# Patient Record
Sex: Female | Born: 1990 | Hispanic: Yes | Marital: Single
Health system: Southern US, Community
[De-identification: ages and names within clinical notes are randomized; demographics above are authoritative.]

---

## 2019-03-17 ENCOUNTER — Emergency Department

## 2019-03-17 ENCOUNTER — Emergency Department
Admission: EM | Admit: 2019-03-17 | Discharge: 2019-03-17 | Disposition: A | Discharge status: Home / Self Care | Attending: Emergency Medicine | Admitting: Emergency Medicine

## 2019-03-17 ENCOUNTER — Other Ambulatory Visit: Payer: Self-pay

## 2019-03-17 DIAGNOSIS — R1031 Right lower quadrant pain: Secondary | ICD-10-CM | POA: Diagnosis present

## 2019-03-17 DIAGNOSIS — N83201 Unspecified ovarian cyst, right side: Secondary | ICD-10-CM

## 2019-03-17 LAB — COMPREHENSIVE METABOLIC PANEL
ALT: 26 U/L (ref 0–44)
AST: 27 U/L (ref 15–41)
Albumin: 3.9 g/dL (ref 3.5–5.0)
Alkaline Phosphatase: 101 U/L (ref 38–126)
Anion gap: 6 (ref 5–15)
BUN: 13 mg/dL (ref 6–20)
CO2: 26 mmol/L (ref 22–32)
Calcium: 9.2 mg/dL (ref 8.9–10.3)
Chloride: 108 mmol/L (ref 98–111)
Creatinine, Ser: 0.58 mg/dL (ref 0.44–1.00)
GFR calc Af Amer: >60 mL/min (ref >60)
GFR calc non Af Amer: >60 mL/min (ref >60)
Glucose, Bld: 101 mg/dL — ABNORMAL HIGH (ref 70–99)
Potassium: 3.9 mmol/L (ref 3.5–5.1)
Sodium: 140 mmol/L (ref 135–145)
Total Bilirubin: 0.3 mg/dL (ref 0.3–1.2)
Total Protein: 8 g/dL (ref 6.5–8.1)

## 2019-03-17 LAB — CBC WITH DIFFERENTIAL/PLATELET
Abs Immature Granulocytes: 0.04 10*3/uL (ref 0.00–0.07)
Basophils Absolute: 0 10*3/uL (ref 0.0–0.1)
Basophils Relative: 0 %
Eosinophils Absolute: 0.2 10*3/uL (ref 0.0–0.5)
Eosinophils Relative: 2 %
HCT: 39.8 % (ref 36.0–46.0)
Hemoglobin: 13.2 g/dL (ref 12.0–15.0)
Immature Granulocytes: 0 %
Lymphocytes Relative: 24 %
Lymphs Abs: 2.7 10*3/uL (ref 0.7–4.0)
MCH: 27.7 pg (ref 26.0–34.0)
MCHC: 33.2 g/dL (ref 30.0–36.0)
MCV: 83.6 fL (ref 80.0–100.0)
Monocytes Absolute: 0.6 10*3/uL (ref 0.1–1.0)
Monocytes Relative: 6 %
Neutro Abs: 7.5 10*3/uL (ref 1.7–7.7)
Neutrophils Relative %: 68 %
Platelets: 277 10*3/uL (ref 150–400)
RBC: 4.76 MIL/uL (ref 3.87–5.11)
RDW: 12.6 % (ref 11.5–15.5)
WBC: 11.1 10*3/uL — ABNORMAL HIGH (ref 4.0–10.5)
nRBC: 0 % (ref 0.0–0.2)

## 2019-03-17 LAB — URINALYSIS, COMPLETE (UACMP) WITH MICROSCOPIC
Bacteria, UA: NONE SEEN
Bilirubin Urine: NEGATIVE
Glucose, UA: NEGATIVE mg/dL
Ketones, ur: NEGATIVE mg/dL
Nitrite: NEGATIVE
Protein, ur: NEGATIVE mg/dL
Specific Gravity, Urine: 1.024 (ref 1.005–1.030)
pH: 6 (ref 5.0–8.0)

## 2019-03-17 LAB — POCT PREGNANCY, URINE: Preg Test, Ur: NEGATIVE

## 2019-03-17 LAB — HCG, QUANTITATIVE, PREGNANCY: hCG, Beta Chain, Quant, S: <1 m[IU]/mL (ref <5)

## 2019-03-17 MED ORDER — OXYCODONE-ACETAMINOPHEN 5-325 MG PO TABS: 1.0000 | ORAL_TABLET | Freq: Three times a day (TID) | ORAL | 0 refills | Status: AC | PRN

## 2019-03-17 MED ORDER — KETOROLAC TROMETHAMINE 30 MG/ML IJ SOLN
15.0000 mg | Freq: Once | INTRAMUSCULAR | Status: AC
  Administered 2019-03-17: 15 mg via INTRAVENOUS
  Filled 2019-03-17: qty 1

## 2019-03-17 NOTE — ED Provider Notes (Signed)
[facility] Emergency Department Provider Note  ____________________________________________  Time seen: Approximately 6:24 AM  I have reviewed the triage vital signs and the nursing notes.   HISTORY  Chief Complaint Abdominal Pain   HPI Daniel NonesYolibeth Aslin is a 28 y.o. female with no significant past medical history who presents for evaluation of abdominal pain.  Patient reports that she was at work and her usual state of health when she lifted a piece of plastic that was very heavy.  She felt a pop in her lower abdomen and developed immediate pain.  She is complaining of severe constant sharp pain located in the right lower quadrant and nonradiating.  No vomiting, no diarrhea, no dysuria, no hematuria, no vaginal discharge, no fever. LMP 5/26   PMH None - reviewed  Allergies Patient has no allergy information on record.  No family history on file.  Social History Smoking - no Drugs - no Alcohol - no   Review of Systems  Constitutional: Negative for fever. Eyes: Negative for visual changes. ENT: Negative for sore throat. Neck: No neck pain  Cardiovascular: Negative for chest pain. Respiratory: Negative for shortness of breath. Gastrointestinal: + lower abdominal pain. No vomiting or diarrhea. Genitourinary: Negative for dysuria. Musculoskeletal: Negative for back pain. Skin: Negative for rash. Neurological: Negative for headaches, weakness or numbness. Psych: No SI or HI  ____________________________________________   PHYSICAL EXAM:  VITAL SIGNS: ED Triage Vitals  Enc Vitals Group     BP 03/17/19 0610 138/78     Pulse Rate 03/17/19 0610 80     Resp 03/17/19 0610 20     Temp 03/17/19 0610 98.6 F (37 C)     Temp Source 03/17/19 0610 Oral     SpO2 03/17/19 0610 98 %     Weight 03/17/19 0611 230 lb (104.3 kg)     Height --      Head Circumference --      Peak Flow --      Pain Score 03/17/19 0611 8     Pain Loc --      Pain Edu?  --      Excl. in GC? --     Constitutional: Alert and oriented. Well appearing and in no apparent distress. HEENT:      Head: Normocephalic and atraumatic.         Eyes: Conjunctivae are normal. Sclera is non-icteric.       Mouth/Throat: Mucous membranes are moist.       Neck: Supple with no signs of meningismus. Cardiovascular: Regular rate and rhythm. No murmurs, gallops, or rubs. 2+ symmetrical distal pulses are present in all extremities. No JVD. Respiratory: Normal respiratory effort. Lungs are clear to auscultation bilaterally. No wheezes, crackles, or rhonchi.  Gastrointestinal: Soft, tender to palpation over the RLQ, non distended with positive bowel sounds. No rebound or guarding. Genitourinary: No CVA tenderness. Musculoskeletal: Nontender with normal range of motion in all extremities. No edema, cyanosis, or erythema of extremities. Neurologic: Normal speech and language. Face is symmetric. Moving all extremities. No gross focal neurologic deficits are appreciated. Skin: Skin is warm, dry and intact. No rash noted. Psychiatric: Mood and affect are normal. Speech and behavior are normal.  ____________________________________________   LABS (all labs ordered are listed, but only abnormal results are displayed)  Labs Reviewed  HCG, QUANTITATIVE, PREGNANCY  URINALYSIS, COMPLETE (UACMP) WITH MICROSCOPIC  CBC WITH DIFFERENTIAL/PLATELET  COMPREHENSIVE METABOLIC PANEL   ____________________________________________  EKG  none  ____________________________________________  RADIOLOGY  TVUS: PND ____________________________________________   PROCEDURES  Procedure(s) performed: None Procedures Critical Care performed:  None ____________________________________________   INITIAL IMPRESSION / ASSESSMENT AND PLAN / ED COURSE  28 y.o. female with no significant past medical history who presents for evaluation of sudden onset RLQ abdominal pain after feeling a pop while  lifting something heavy at work. Patient is ttp over the RLQ. Based on history low suspicion for appendicitis. No abdominal hernia seen. Possibly ovarian cyst rupture vs torsion vs ectopic pregnancy versus kidney stone. Will check labs, TVUS, give toradol for pain.     _________________________ 7:00 AM on 03/17/2019 ----------------------------------------- Labs and [place] pending. Care transferred to dr. Jimmye Norman.     As part of my medical decision making, I reviewed the following data within the [facility] notes reviewed and incorporated, Notes from prior ED visits and [place] Controlled Substance Database    Pertinent labs & imaging results that were available during my care of the patient were reviewed by me and considered in my medical decision making (see chart for details).    ____________________________________________   FINAL CLINICAL IMPRESSION(S) / ED DIAGNOSES  Final diagnoses:  RLQ abdominal pain  RLQ abdominal pain      NEW MEDICATIONS STARTED DURING THIS VISIT:  ED Discharge Orders    None       Note:  This document was prepared using Dragon voice recognition software and may include unintentional dictation errors.    Alfred Levins, [place], MD 03/17/19 [phone]

## 2019-03-17 NOTE — ED Triage Notes (Signed)
Pt to triage via w/c with no distress noted, mask in place; per video interpreter, pt reports was at work lifting a heavy object when she felt sharp pain to suprapubic area. Denies any hx of the same.

## 2019-03-17 NOTE — ED Provider Notes (Signed)
IMPRESSION: 1. Probable hemorrhagic cyst arising from right ovary measuring 2.3 x 1.7 x 2.2 cm. Short-interval follow up ultrasound in 6-12 weeks is recommended, preferably during the week following the patient's normal menses.  2.  No other extrauterine pelvic mass.  No free pelvic fluid.  3.  No findings indicative of ovarian torsion.  4.  Uterus and endometrium appear unremarkable.  Likely cause for her pain seems to be ovarian cyst.  She will be discharged with pain medicine and GYN follow-up.   Earleen Newport, MD 03/17/19 [phone]

## 2019-03-17 NOTE — ED Notes (Signed)
Pt transported to US at this time via wheelchair.

## 2019-03-17 NOTE — ED Notes (Signed)
Interpreter present, pt verbalizes understanding of d/c instructions, medications and follow up.

## 2019-10-19 IMAGING — US US PELVIS COMPLETE
1 series · 13 of 25 positions shown · non-contrast
Comparison: None.

CLINICAL DATA: Pelvic pain

EXAM:
TRANSABDOMINAL AND TRANSVAGINAL ULTRASOUND OF PELVIS
DOPPLER ULTRASOUND OF OVARIES
TECHNIQUE: Study was performed transabdominally to optimize pelvic field of
view evaluation and transvaginally to optimize internal visceral
architecture evaluation. Color and duplex Doppler ultrasound was
utilized to evaluate blood flow to the ovaries.

[Series 1: us pelvis complete · 0.26mm/px · 13 of 142 slices shown]
[im 1/142]
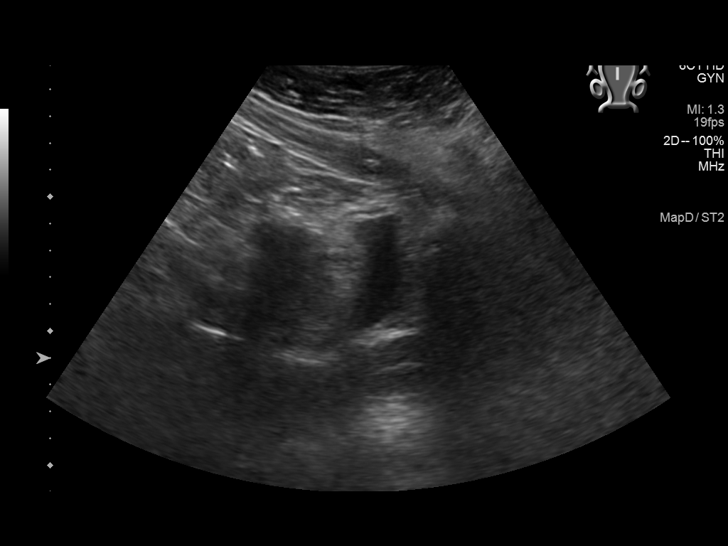
[im 12/142]
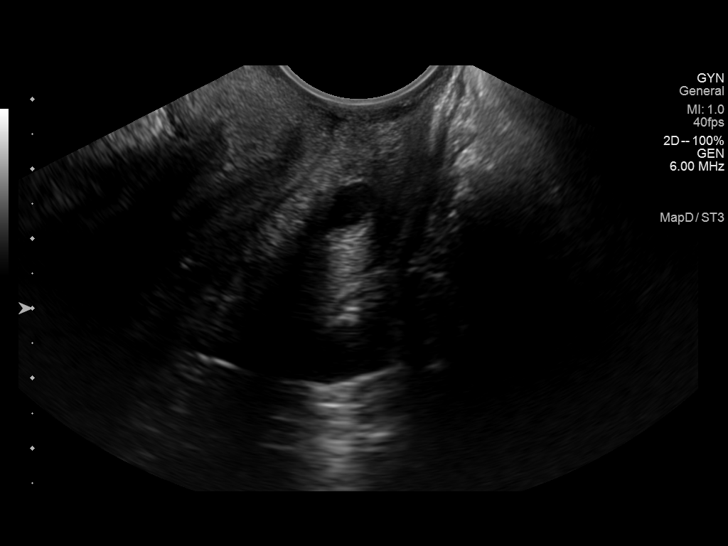
[im 24/142]
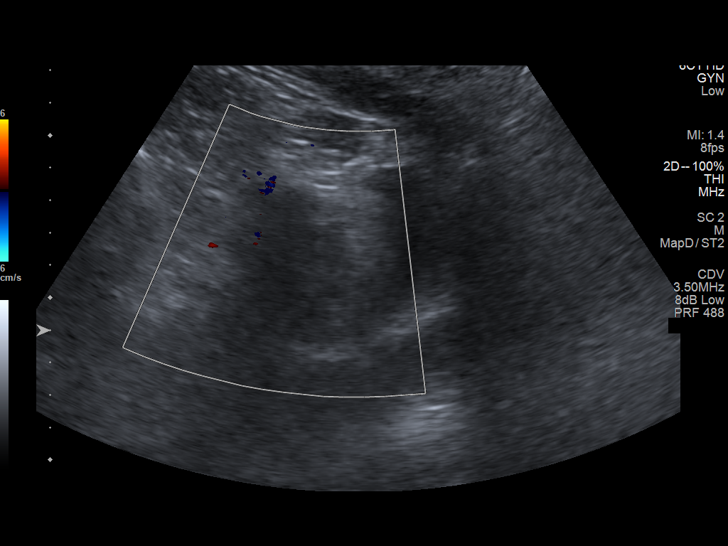
[im 36/142]
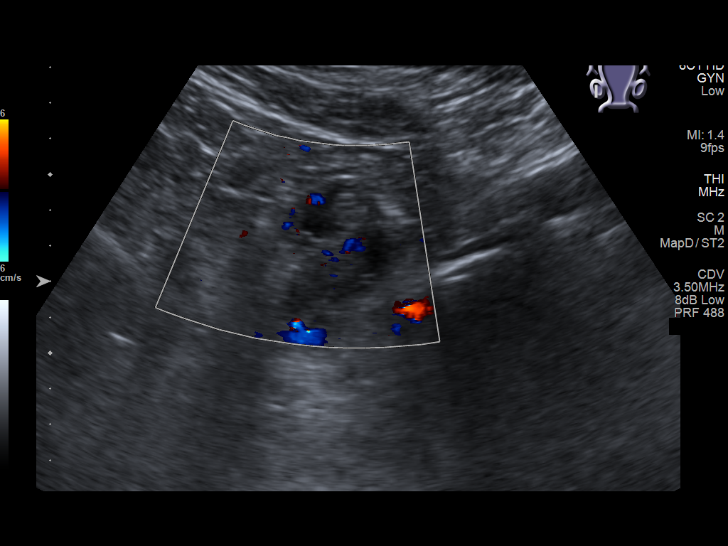
[im 48/142]
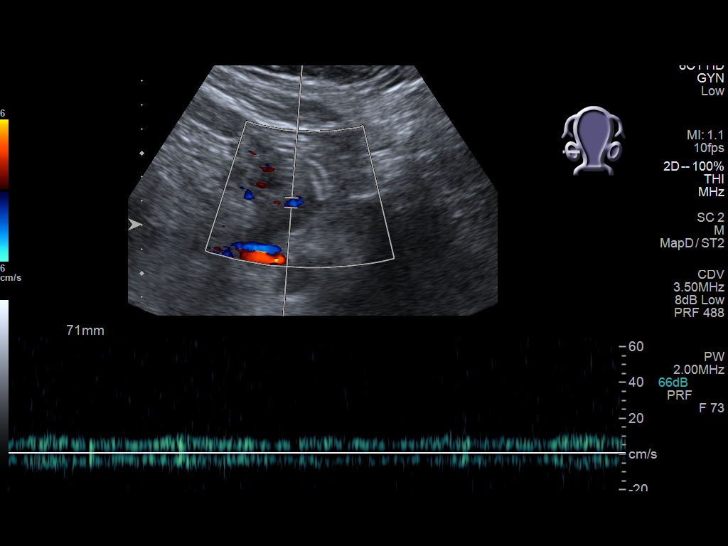
[im 59/142]
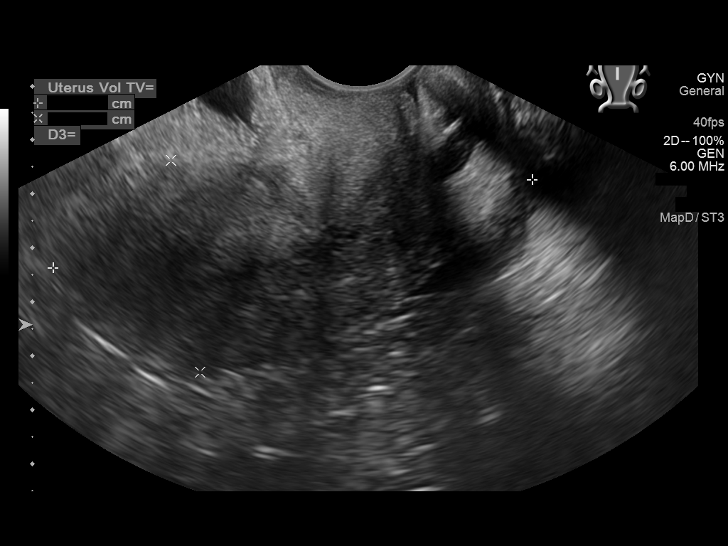
[im 71/142]
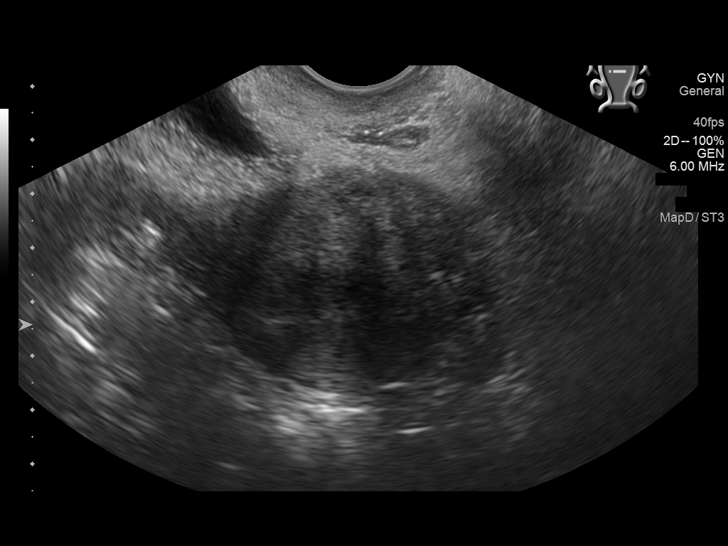
[im 83/142]
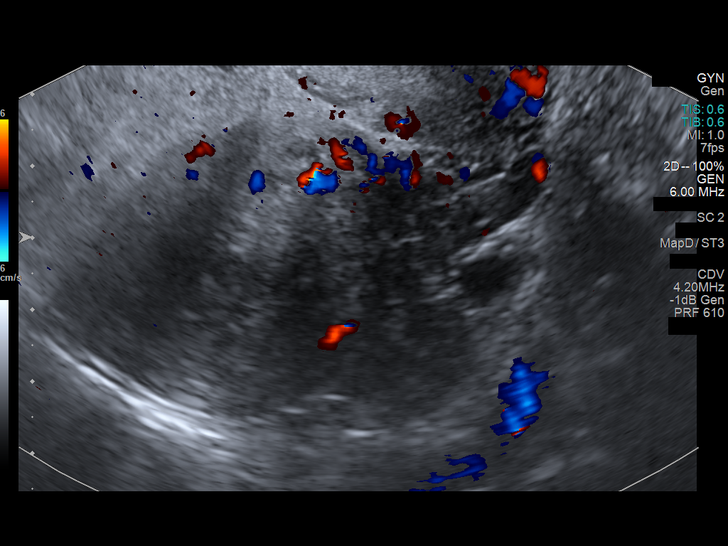
[im 95/142]
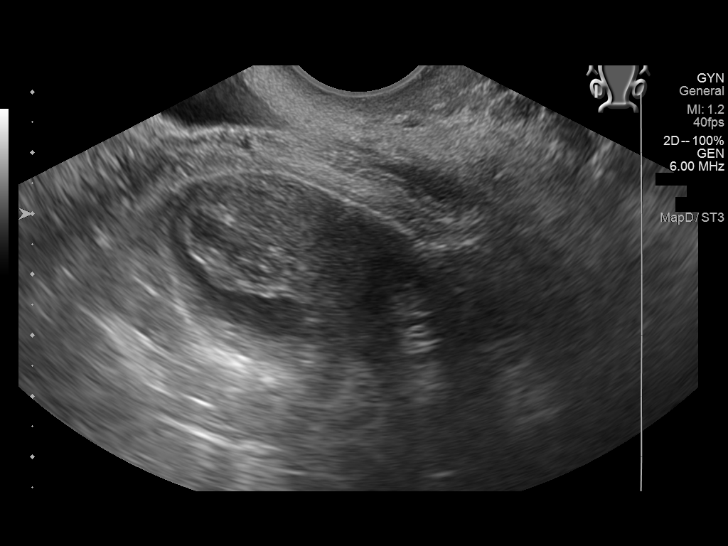
[im 106/142]
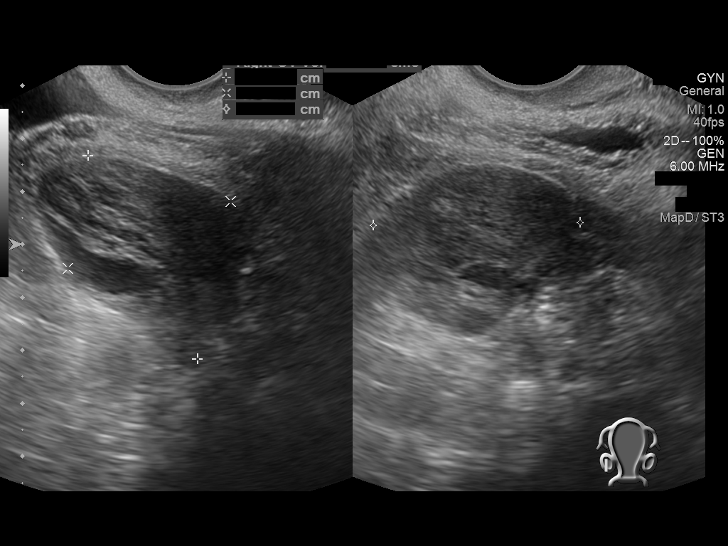
[im 118/142]
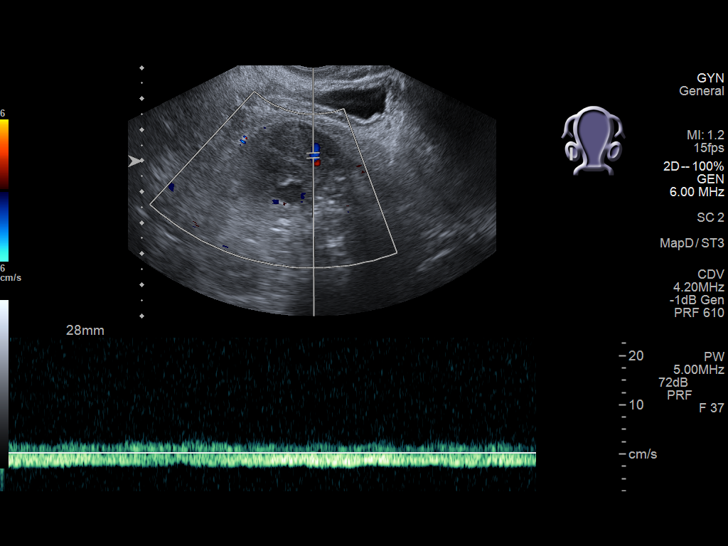
[im 130/142]
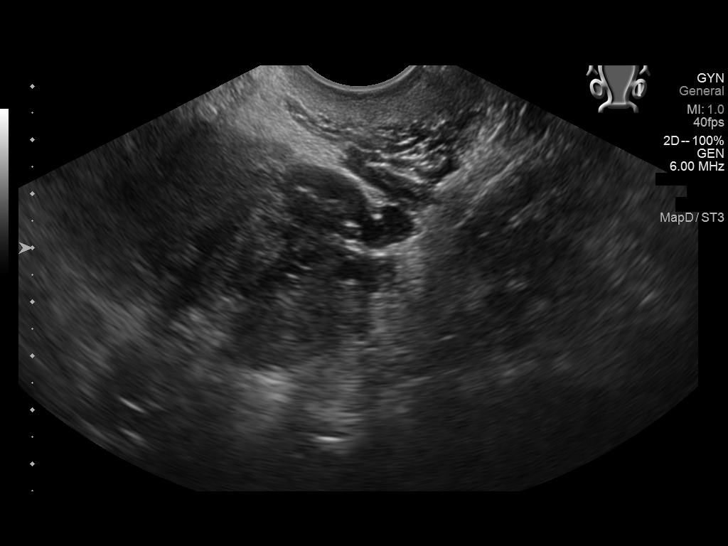
[im 142/142]
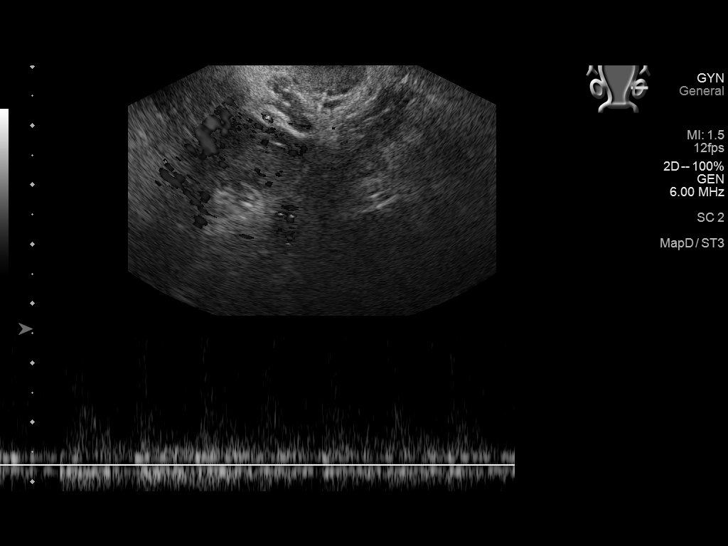

[13 of 25 positions shown; findings below may reference images not displayed]

FINDINGS: Uterus

Measurements: 5.0 x 4.0 x 4.9 cm = volume: 92.3 mL. No fibroids or
other mass visualized.

Endometrium

Thickness: 7 mm.  No focal abnormality visualized.

Right ovary

Measurements: 4.4 x 3.3 x 3.9 cm = volume: 29.6 mL. There is a
mildly complex cystic area in the right ovary measuring 2.3 x 1.7 x
2.2 cm. No other extrauterine pelvic mass on the right.

Left ovary

Measurements: 2.7 x 2.1 x 1.7 cm = volume: 5.0 mL. Normal
appearance/no adnexal mass.

Pulsed Doppler evaluation of both ovaries demonstrates normal
low-resistance arterial and venous waveforms.

Other findings

No abnormal free fluid.
IMPRESSION: 1. Probable hemorrhagic cyst arising from right ovary measuring
x 1.7 x 2.2 cm. Short-interval follow up ultrasound in 6-12 weeks is
recommended, preferably during the week following the patient's
normal menses.

2.  No other extrauterine pelvic mass.  No free pelvic fluid.

3.  No findings indicative of ovarian torsion.

4.  Uterus and endometrium appear unremarkable.
